# Patient Record
Sex: Male | Born: 1975 | Race: White | Hispanic: No | Marital: Married
Health system: Southern US, Community
[De-identification: ages and names within clinical notes are randomized; demographics above are authoritative.]

---

## 1997-11-07 ENCOUNTER — Observation Stay (HOSPITAL_COMMUNITY): Admission: EM | Admit: 1997-11-07 | Discharge: 1997-11-08 | Payer: Self-pay | Admitting: Emergency Medicine

## 2009-08-22 ENCOUNTER — Emergency Department (HOSPITAL_COMMUNITY): Admission: EM | Admit: 2009-08-22 | Discharge: 2009-08-22 | Payer: Self-pay | Admitting: Emergency Medicine

## 2010-08-05 LAB — POCT I-STAT, CHEM 8
BUN: 13 mg/dL (ref 6–23)
Chloride: 105 mEq/L (ref 96–112)
Creatinine, Ser: 1 mg/dL (ref 0.4–1.5)
Glucose, Bld: 94 mg/dL (ref 70–99)
Hemoglobin: 15.3 g/dL (ref 13.0–17.0)
Potassium: 3.4 mEq/L — ABNORMAL LOW (ref 3.5–5.1)
Sodium: 136 mEq/L (ref 135–145)

## 2010-08-05 LAB — URINALYSIS, ROUTINE W REFLEX MICROSCOPIC
Ketones, ur: NEGATIVE mg/dL
Nitrite: NEGATIVE
Protein, ur: NEGATIVE mg/dL
Urobilinogen, UA: 0.2 mg/dL (ref 0.0–1.0)

## 2010-08-05 LAB — RAPID URINE DRUG SCREEN, HOSP PERFORMED: Barbiturates: NOT DETECTED

## 2010-08-05 LAB — GLUCOSE, CAPILLARY

## 2013-10-03 ENCOUNTER — Other Ambulatory Visit: Payer: Self-pay | Admitting: Internal Medicine

## 2013-10-03 DIAGNOSIS — N50819 Testicular pain, unspecified: Secondary | ICD-10-CM

## 2013-10-04 ENCOUNTER — Ambulatory Visit
Admission: RE | Admit: 2013-10-04 | Discharge: 2013-10-04 | Disposition: A | Discharge status: Home / Self Care | Payer: BC Managed Care – PPO | Source: Ambulatory Visit | Attending: Internal Medicine | Admitting: Internal Medicine

## 2013-10-04 DIAGNOSIS — N50819 Testicular pain, unspecified: Secondary | ICD-10-CM

## 2015-04-02 IMAGING — US US SCROTUM
1 series · 13 of 25 positions shown · non-contrast
Comparison: None.

CLINICAL DATA: Right testicular pain for 5 days. No acute injury

EXAM:
SCROTAL ULTRASOUND
DOPPLER ULTRASOUND OF THE TESTICLES
TECHNIQUE: Complete ultrasound examination of the testicles, epididymis, and
other scrotal structures was performed. Color and spectral Doppler
ultrasound were also utilized to evaluate blood flow to the
testicles.

[Series 1: us scrotum · 0.08mm/px · 13 of 64 slices shown]
[im 1/64]
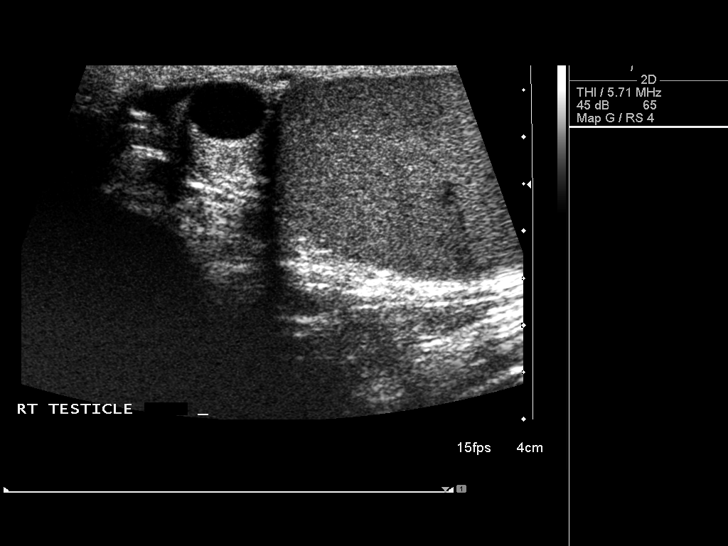
[im 6/64]
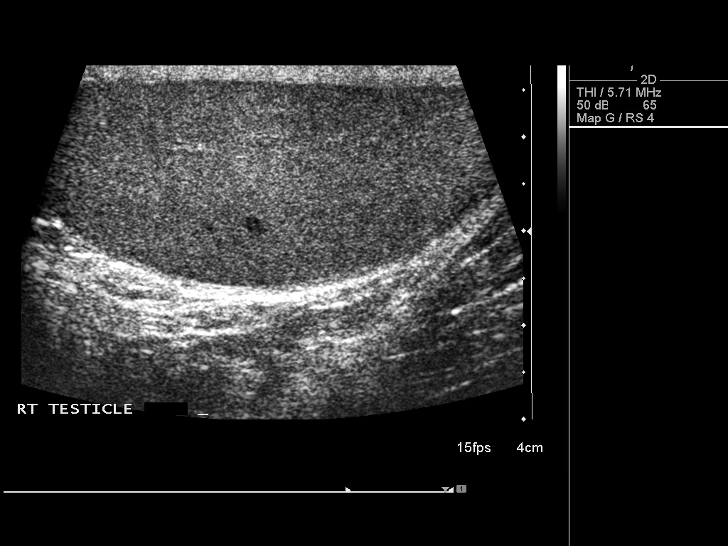
[im 11/64]
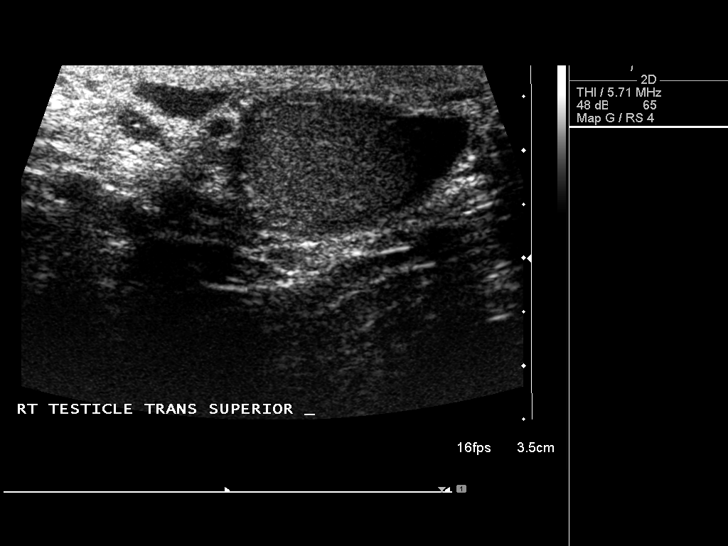
[im 16/64]
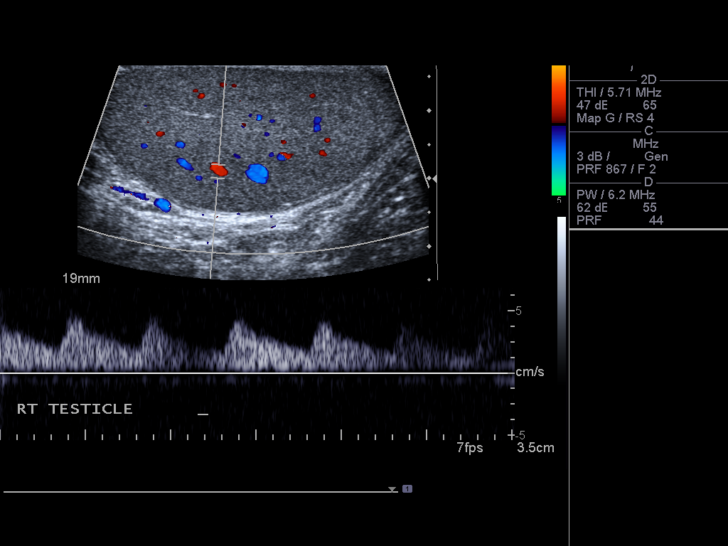
[im 22/64]
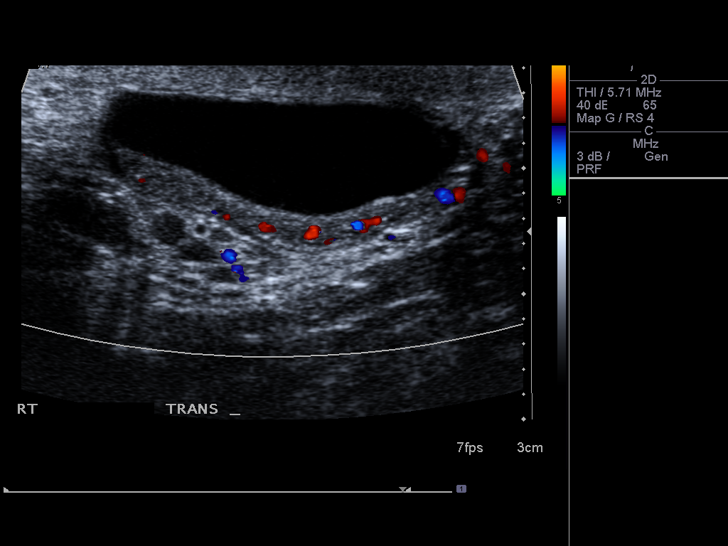
[im 27/64]
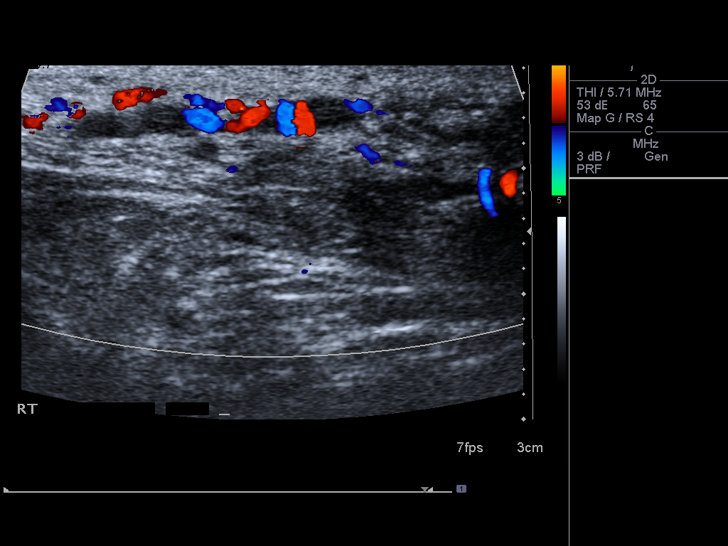
[im 32/64]
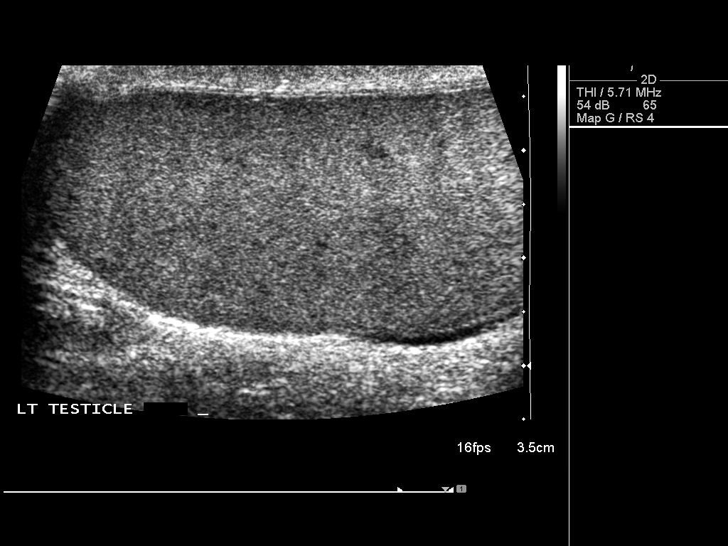
[im 37/64]
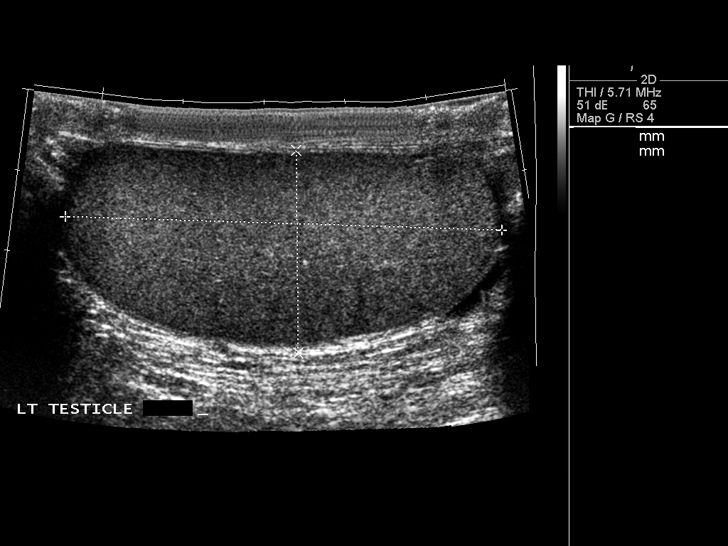
[im 43/64]
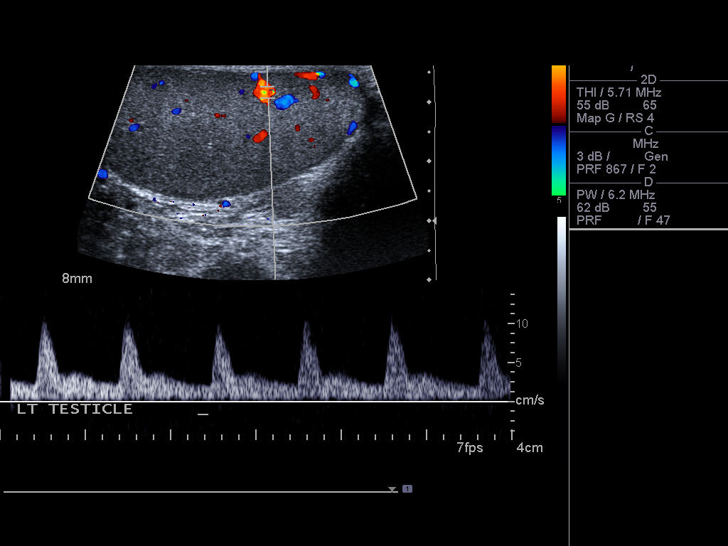
[im 48/64]
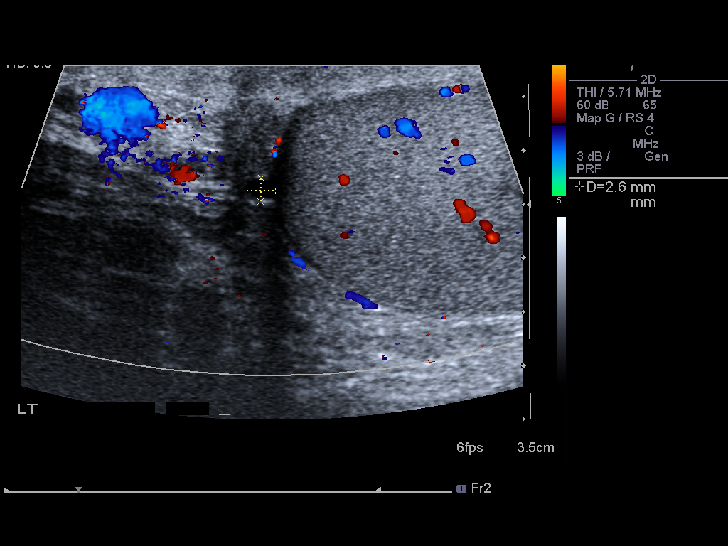
[im 53/64]
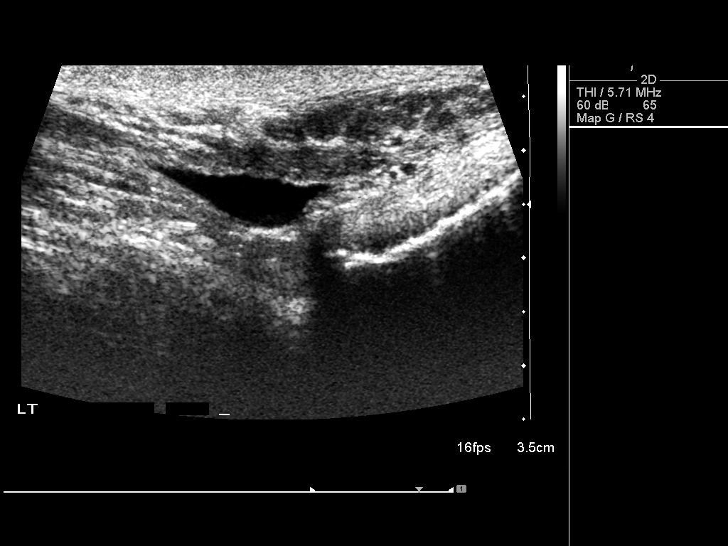
[im 58/64]
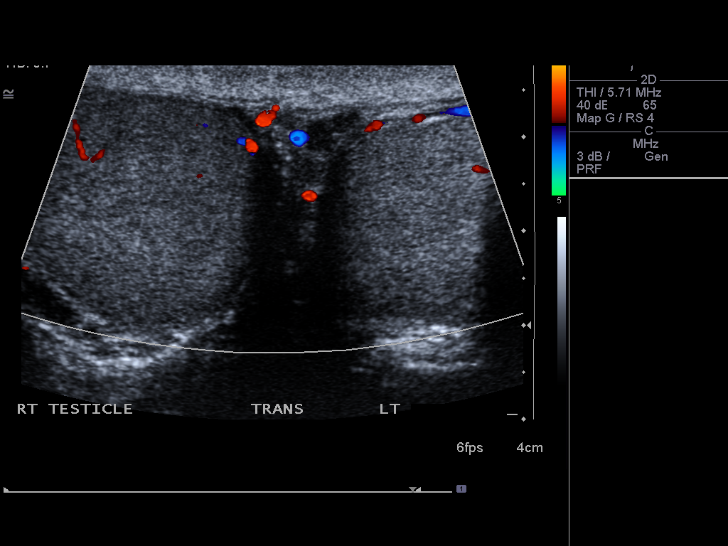
[im 64/64]
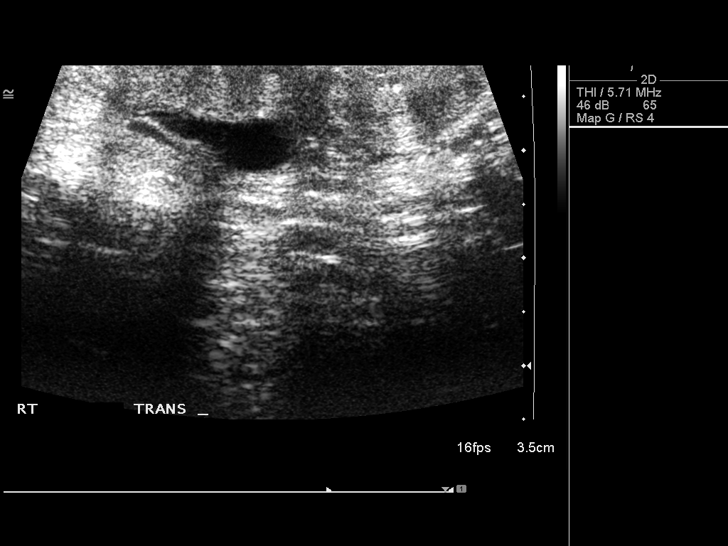

[13 of 25 positions shown; findings below may reference images not displayed]

FINDINGS: Right testicle

Measurements: The right testicle measures 5.5 x 2.3 x 3.3 cm. . No
intratesticular abnormality is seen. Blood flow is demonstrated to
the right testicle with arterial and venous waveforms.

Left testicle

Measurements: The left testicle measures 5.4 x 2.5 x 2.9 cm. . No
intratesticular abnormality is seen. Blood flow is demonstrated to
the left testicle with arterial and venous waveforms.

Right epididymis: There is an elongated right epididymal cyst
present of 1.3 x 0.9 x 2.9 cm. A cystic structure also emanates from
the epididymis in the region of the appendix epididymis most
consistent with small epididymal cysts as well but

Left epididymis: Small epididymal cysts are noted on the left as
well.

Hydrocele: There is a small amount of fluid bilaterally which
appears physiologic.

Varicocele: No varicocele is seen.

Pulsed Doppler interrogation of both testes demonstrates .
IMPRESSION: 1. No intratesticular abnormality. Blood flow is demonstrated to
both testicles.
2. Epididymal cysts right larger than left.

## 2015-05-01 ENCOUNTER — Ambulatory Visit: Payer: BC Managed Care – PPO | Admitting: Podiatry
